# Patient Record
Sex: Female | Born: 1995 | Race: Black or African American | Hispanic: No | Marital: Single | Smoking: Never smoker
Health system: Southern US, Community
[De-identification: ages and names within clinical notes are randomized; demographics above are authoritative.]

## PROBLEM LIST (undated history)

## (undated) ENCOUNTER — Inpatient Hospital Stay (HOSPITAL_COMMUNITY): Payer: Self-pay

---
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
## 2020-06-05 ENCOUNTER — Other Ambulatory Visit: Payer: Self-pay

## 2020-06-06 ENCOUNTER — Ambulatory Visit (INDEPENDENT_AMBULATORY_CARE_PROVIDER_SITE_OTHER): Payer: 59 | Admitting: Nurse Practitioner

## 2020-06-06 ENCOUNTER — Other Ambulatory Visit (HOSPITAL_COMMUNITY)
Admission: RE | Admit: 2020-06-06 | Discharge: 2020-06-06 | Disposition: A | Discharge status: Home / Self Care | Payer: 59 | Source: Ambulatory Visit | Attending: Nurse Practitioner | Admitting: Nurse Practitioner

## 2020-06-06 ENCOUNTER — Encounter: Payer: Self-pay | Admitting: Nurse Practitioner

## 2020-06-06 VITALS — BP 110/80 | HR 82 | Temp 97.3°F | Ht 64.0 in | Wt 150.4 lb

## 2020-06-06 DIAGNOSIS — Z0000 Encounter for general adult medical examination without abnormal findings: Secondary | ICD-10-CM

## 2020-06-06 DIAGNOSIS — Z124 Encounter for screening for malignant neoplasm of cervix: Secondary | ICD-10-CM | POA: Diagnosis not present

## 2020-06-06 DIAGNOSIS — Z113 Encounter for screening for infections with a predominantly sexual mode of transmission: Secondary | ICD-10-CM | POA: Diagnosis not present

## 2020-06-06 LAB — CBC WITH DIFFERENTIAL/PLATELET
Basophils Absolute: 0 10*3/uL (ref 0.0–0.1)
Basophils Relative: 0.6 % (ref 0.0–3.0)
Eosinophils Absolute: 0 10*3/uL (ref 0.0–0.7)
Eosinophils Relative: 0.3 % (ref 0.0–5.0)
HCT: 40.6 % (ref 36.0–46.0)
Hemoglobin: 13 g/dL (ref 12.0–15.0)
Lymphocytes Relative: 31.1 % (ref 12.0–46.0)
Lymphs Abs: 1.8 10*3/uL (ref 0.7–4.0)
MCHC: 32.1 g/dL (ref 30.0–36.0)
MCV: 84.7 fl (ref 78.0–100.0)
Monocytes Absolute: 0.7 10*3/uL (ref 0.1–1.0)
Monocytes Relative: 11 % (ref 3.0–12.0)
Neutro Abs: 3.4 10*3/uL (ref 1.4–7.7)
Neutrophils Relative %: 57 % (ref 43.0–77.0)
Platelets: 302 10*3/uL (ref 150.0–400.0)
RBC: 4.79 Mil/uL (ref 3.87–5.11)
RDW: 14.1 % (ref 11.5–15.5)
WBC: 5.9 10*3/uL (ref 4.0–10.5)

## 2020-06-06 LAB — COMPREHENSIVE METABOLIC PANEL
ALT: 7 U/L (ref 0–35)
AST: 16 U/L (ref 0–37)
Albumin: 4.1 g/dL (ref 3.5–5.2)
Alkaline Phosphatase: 48 U/L (ref 39–117)
BUN: 11 mg/dL (ref 6–23)
CO2: 25 mEq/L (ref 19–32)
Calcium: 9.5 mg/dL (ref 8.4–10.5)
Chloride: 104 mEq/L (ref 96–112)
Creatinine, Ser: 0.98 mg/dL (ref 0.40–1.20)
GFR: 81.44 mL/min (ref >60.00)
Glucose, Bld: 81 mg/dL (ref 70–99)
Potassium: 4.3 mEq/L (ref 3.5–5.1)
Sodium: 138 mEq/L (ref 135–145)
Total Bilirubin: 0.4 mg/dL (ref 0.2–1.2)
Total Protein: 7.7 g/dL (ref 6.0–8.3)

## 2020-06-06 LAB — TSH: TSH: 1.56 u[IU]/mL (ref 0.35–4.50)

## 2020-06-06 NOTE — Patient Instructions (Addendum)
Thank you for choosing [facility].  Go to lab for blood draw.  Return to lab in 1week for urine cytology.   Preventive Care 18-23 Years Old, Female Preventive care refers to lifestyle choices and visits with your health care provider that can promote health and wellness. This includes:  A yearly physical exam. This is also called an annual wellness visit.  Regular dental and eye exams.  Immunizations.  Screening for certain conditions.  Healthy lifestyle choices, such as: ? Eating a healthy diet. ? Getting regular exercise. ? Not using drugs or products that contain nicotine and tobacco. ? Limiting alcohol use. What can I expect for my preventive care visit? Physical exam Your health care provider may check your:  Height and weight. These may be used to calculate your BMI (body mass index). BMI is a measurement that tells if you are at a healthy weight.  Heart rate and blood pressure.  Body temperature.  Skin for abnormal spots. Counseling Your health care provider may ask you questions about your:  Past medical problems.  Family's medical history.  Alcohol, tobacco, and drug use.  Emotional well-being.  Home life and relationship well-being.  Sexual activity.  Diet, exercise, and sleep habits.  Work and work Statistician.  Access to firearms.  Method of birth control.  Menstrual cycle.  Pregnancy history. What immunizations do I need? Vaccines are usually given at various ages, according to a schedule. Your health care provider will recommend vaccines for you based on your age, medical history, and lifestyle or other factors, such as travel or where you work.   What tests do I need? Blood tests  Lipid and cholesterol levels. These may be checked every 5 years starting at age 51.  Hepatitis C test.  Hepatitis B test. Screening  Diabetes screening. This is done by checking your blood sugar (glucose) after you have not eaten for a while  (fasting).  STD (sexually transmitted disease) testing, if you are at risk.  BRCA-related cancer screening. This may be done if you have a family history of breast, ovarian, tubal, or peritoneal cancers.  Pelvic exam and Pap test. This may be done every 3 years starting at age 37. Starting at age 62, this may be done every 5 years if you have a Pap test in combination with an HPV test. Talk with your health care provider about your test results, treatment options, and if necessary, the need for more tests.   Follow these instructions at home: Eating and drinking  Eat a healthy diet that includes fresh fruits and vegetables, whole grains, lean protein, and low-fat dairy products.  Take vitamin and mineral supplements as recommended by your health care provider.  Do not drink alcohol if: ? Your health care provider tells you not to drink. ? You are pregnant, may be pregnant, or are planning to become pregnant.  If you drink alcohol: ? Limit how much you have to 0-1 drink a day. ? Be aware of how much alcohol is in your drink. In the [place] one drink equals one 12 oz bottle of beer (355 mL), one 5 oz glass of wine (148 mL), or one 1 oz glass of hard liquor (44 mL).   Lifestyle  Take daily care of your teeth and gums. Brush your teeth every morning and night with fluoride toothpaste. Floss one time each day.  Stay active. Exercise for at least 30 minutes 5 or more days each week.  Do not use any products  that contain nicotine or tobacco, such as cigarettes, e-cigarettes, and chewing tobacco. If you need help quitting, ask your health care provider.  Do not use drugs.  If you are sexually active, practice safe sex. Use a condom or other form of protection to prevent STIs (sexually transmitted infections).  If you do not wish to become pregnant, use a form of birth control. If you plan to become pregnant, see your health care provider for a prepregnancy visit.  Find healthy ways to cope  with stress, such as: ? Meditation, yoga, or listening to music. ? Journaling. ? Talking to a trusted person. ? Spending time with friends and family. Safety  Always wear your seat belt while driving or riding in a vehicle.  Do not drive: ? If you have been drinking alcohol. Do not ride with someone who has been drinking. ? When you are tired or distracted. ? While texting.  Wear a helmet and other protective equipment during sports activities.  If you have firearms in your house, make sure you follow all gun safety procedures.  Seek help if you have been physically or sexually abused. What's next?  Go to your health care provider once a year for an annual wellness visit.  Ask your health care provider how often you should have your eyes and teeth checked.  Stay up to date on all vaccines. This information is not intended to replace advice given to you by your health care provider. Make sure you discuss any questions you have with your health care provider. Document Revised: 08/27/2019 Document Reviewed: 09/09/2017 Elsevier Patient Education  2021 [employer].

## 2020-06-06 NOTE — Progress Notes (Signed)
Subjective:  Patient ID: Amber Shepard, female    DOB: 1995/05/18  Age: 25 y.o. MRN: [id number]  CC: Establish Care (New patient-Pap and breast exam needed. Pt is fasting. )  HPI She denies any acute compliant. She has completed HPV, TDAP and 2doses of COVID vaccines. She is sexually active with partner. She agreed to STD screen today  She exercises 2-3x/week, has regular diet  Wt Readings from Last 3 Encounters:  06/06/20 150 lb 6.4 oz (68.2 kg)   BP Readings from Last 3 Encounters:  06/06/20 110/80   Depression screen PHQ 2/9 06/06/2020  Decreased Interest 0  Down, Depressed, Hopeless 0  PHQ - 2 Score 0  Altered sleeping 0  Tired, decreased energy 1  Change in appetite 0  Feeling bad or failure about yourself  0  Trouble concentrating 0  Moving slowly or fidgety/restless 0  Suicidal thoughts 0  PHQ-9 Score 1  Difficult doing work/chores Not difficult at all   GAD 7 : Generalized Anxiety Score 06/06/2020  Nervous, Anxious, on Edge 1  Control/stop worrying 1  Worry too much - different things 0  Trouble relaxing 0  Restless 0  Easily annoyed or irritable 0  Afraid - awful might happen 0  Total GAD 7 Score 2  Anxiety Difficulty Not difficult at all   Fall Risk  06/06/2020  Falls in the past year? 0  Number falls in past yr: 0  Injury with Fall? 0  Risk for fall due to : No Fall Risks  Follow up Falls evaluation completed   History reviewed. No pertinent past medical history.   Social History   Socioeconomic History  . Marital status: Single    Spouse name: Not on file  . Number of children: 0  . Years of education: Not on file  . Highest education level: Not on file  Occupational History  . Not on file  Tobacco Use  . Smoking status: Never Smoker  . Smokeless tobacco: Never Used  Vaping Use  . Vaping Use: Never used  Substance and Sexual Activity  . Alcohol use: Yes    Comment: wine occassionally   . Drug use: Never  . Sexual activity: Not  Currently    Birth control/protection: Pill  Other Topics Concern  . Not on file  Social History Narrative  . Not on file   Social Determinants of Health   Financial Resource Strain: Not on file  Food Insecurity: Not on file  Transportation Needs: Not on file  Physical Activity: Not on file  Stress: Not on file  Social Connections: Not on file  Intimate Partner Violence: Not on file   Outpatient Medications Prior to Visit  Medication Sig Dispense Refill  . doxycycline (VIBRAMYCIN) 100 MG capsule Take 100 mg by mouth in the morning and at bedtime.    . norgestimate-ethinyl estradiol (ORTHO-CYCLEN) 0.25-35 MG-MCG tablet Take 1 tablet by mouth daily.     No facility-administered medications prior to visit.   History reviewed. No pertinent family history.   ROS Review of Systems  Constitutional: Negative for fever, malaise/fatigue and weight loss.  HENT: Negative for congestion and sore throat.   Eyes:       Negative for visual changes  Respiratory: Negative for cough and shortness of breath.   Cardiovascular: Negative for chest pain, palpitations and leg swelling.  Gastrointestinal: Negative for blood in stool, constipation, diarrhea and heartburn.  Genitourinary: Negative for dysuria, frequency and urgency.  Musculoskeletal: Negative for falls,  joint pain and myalgias.  Skin: Negative for rash.  Neurological: Negative for dizziness, sensory change and headaches.  Endo/Heme/Allergies: Does not bruise/bleed easily.  Psychiatric/Behavioral: Negative for depression, substance abuse and suicidal ideas. The patient is not nervous/anxious and does not have insomnia.     Objective:  BP 110/80 (BP Location: Left Arm, Patient Position: Sitting, Cuff Size: Normal)   Pulse 82   Temp (!) 97.3 F (36.3 C) (Temporal)   Ht 5\' 4"  (1.626 m)   Wt 150 lb 6.4 oz (68.2 kg)   LMP 05/13/2020 (Approximate)   SpO2 100%   BMI 25.82 kg/m   Physical Exam Vitals reviewed. Exam conducted with  a chaperone present.  Constitutional:      General: She is not in acute distress.    Appearance: She is well-developed and normal weight.  HENT:     Right Ear: Tympanic membrane, ear canal and external ear normal.     Left Ear: Tympanic membrane, ear canal and external ear normal.  Eyes:     Extraocular Movements: Extraocular movements intact.     Conjunctiva/sclera: Conjunctivae normal.  Cardiovascular:     Rate and Rhythm: Normal rate and regular rhythm.     Heart sounds: Normal heart sounds.  Pulmonary:     Effort: Pulmonary effort is normal. No respiratory distress.     Breath sounds: Normal breath sounds.  Chest:     Chest wall: No tenderness.  Breasts:     Right: Normal. No axillary adenopathy or supraclavicular adenopathy.     Left: Normal. No axillary adenopathy or supraclavicular adenopathy.    Abdominal:     General: Bowel sounds are normal.     Palpations: Abdomen is soft.     Hernia: There is no hernia in the left inguinal area or right inguinal area.  Genitourinary:    General: Normal vulva.     Labia:        Right: No rash, tenderness or lesion.        Left: No rash, tenderness or lesion.      Urethra: No prolapse or urethral pain.     Vagina: Vaginal discharge present. No erythema or tenderness.     Cervix: Discharge present. No cervical motion tenderness, friability, lesion or erythema.     Uterus: Normal.      Adnexa: Right adnexa normal and left adnexa normal.  Musculoskeletal:        General: Normal range of motion.     Cervical back: Normal range of motion and neck supple.     Right lower leg: No edema.     Left lower leg: No edema.  Lymphadenopathy:     Cervical: No cervical adenopathy.     Upper Body:     Right upper body: No supraclavicular, axillary or pectoral adenopathy.     Left upper body: No supraclavicular, axillary or pectoral adenopathy.     Lower Body: No right inguinal adenopathy. No left inguinal adenopathy.  Skin:    General: Skin  is warm and dry.  Neurological:     Mental Status: She is alert and oriented to person, place, and time.     Deep Tendon Reflexes: Reflexes are normal and symmetric.  Psychiatric:        Mood and Affect: Mood normal.        Behavior: Behavior normal.        Thought Content: Thought content normal.    Assessment & Plan:  This visit occurred during the SARS-CoV-2 public  health emergency.  Safety protocols were in place, including screening questions prior to the visit, additional usage of staff PPE, and extensive cleaning of exam room while observing appropriate contact time as indicated for disinfecting solutions.   Jakirah was seen today for establish care.  Diagnoses and all orders for this visit:  Preventative health care -     CBC with Differential/Platelet -     Comprehensive metabolic panel -     TSH -     HIV antibody (with reflex) -     RPR -     Hepatitis C Antibody -     Cytology - PAP( [facility])  Screen for STD (sexually transmitted disease) -     HIV antibody (with reflex) -     RPR -     Hepatitis C Antibody -     Urine cytology ancillary only([facility]); Future  Encounter for Papanicolaou smear for cervical cancer screening -     Cytology - PAP( [place])   Problem List Items Addressed This Visit   None   Visit Diagnoses    Preventative health care    -  Primary   Relevant Orders   CBC with Differential/Platelet   Comprehensive metabolic panel   TSH   HIV antibody (with reflex)   RPR   Hepatitis C Antibody   Cytology - PAP( [facility])   Screen for STD (sexually transmitted disease)       Relevant Orders   HIV antibody (with reflex)   RPR   Hepatitis C Antibody   Urine cytology ancillary only([facility])   Encounter for Papanicolaou smear for cervical cancer screening       Relevant Orders   Cytology - PAP( )      Follow-up: Return in about 1 year (around 06/06/2021) for CPE (fasting).  Alysia Penna, NP

## 2020-06-07 LAB — HEPATITIS C ANTIBODY
Hepatitis C Ab: NONREACTIVE
SIGNAL TO CUT-OFF: 0.02 (ref <1.00)

## 2020-06-07 LAB — RPR: RPR Ser Ql: NONREACTIVE

## 2020-06-07 LAB — HIV ANTIBODY (ROUTINE TESTING W REFLEX): HIV 1&2 Ab, 4th Generation: NONREACTIVE

## 2020-06-07 LAB — CYTOLOGY - PAP: Diagnosis: NEGATIVE

## 2020-06-13 ENCOUNTER — Other Ambulatory Visit: Payer: 59

## 2020-06-20 ENCOUNTER — Other Ambulatory Visit: Payer: Self-pay

## 2020-06-20 ENCOUNTER — Other Ambulatory Visit (INDEPENDENT_AMBULATORY_CARE_PROVIDER_SITE_OTHER): Payer: 59

## 2020-06-20 ENCOUNTER — Other Ambulatory Visit (HOSPITAL_COMMUNITY)
Admission: RE | Admit: 2020-06-20 | Discharge: 2020-06-20 | Disposition: A | Discharge status: Home / Self Care | Payer: 59 | Source: Ambulatory Visit | Attending: Nurse Practitioner | Admitting: Nurse Practitioner

## 2020-06-20 DIAGNOSIS — Z113 Encounter for screening for infections with a predominantly sexual mode of transmission: Secondary | ICD-10-CM | POA: Diagnosis not present

## 2020-06-20 NOTE — Progress Notes (Signed)
Per orders of NP Alysia Penna pt is here to provide urine sample, pt was ale to leave an adequate amount.

## 2020-06-21 LAB — URINE CYTOLOGY ANCILLARY ONLY
Bacterial Vaginitis-Urine: NEGATIVE
Candida Urine: NEGATIVE
Chlamydia: NEGATIVE
Comment: NEGATIVE
Comment: NORMAL
Neisseria Gonorrhea: NEGATIVE

## 2020-06-24 ENCOUNTER — Telehealth: Payer: Self-pay | Admitting: Nurse Practitioner

## 2020-06-24 NOTE — Telephone Encounter (Signed)
Patient aware of results.

## 2020-06-24 NOTE — Telephone Encounter (Signed)
Pt would like a cb concerning her most recent lab results. Please advise pt at [phone].

## 2021-06-09 ENCOUNTER — Encounter: Payer: 59 | Admitting: Nurse Practitioner

## 2021-06-10 ENCOUNTER — Encounter: Payer: 59 | Admitting: Nurse Practitioner

## 2021-07-22 ENCOUNTER — Encounter: Payer: Self-pay | Admitting: Nurse Practitioner

## 2021-07-22 ENCOUNTER — Ambulatory Visit (INDEPENDENT_AMBULATORY_CARE_PROVIDER_SITE_OTHER): Payer: 59 | Admitting: Nurse Practitioner

## 2021-07-22 VITALS — BP 122/80 | HR 78 | Temp 98.2°F | Ht 64.0 in | Wt 162.0 lb

## 2021-07-22 DIAGNOSIS — Z0000 Encounter for general adult medical examination without abnormal findings: Secondary | ICD-10-CM | POA: Diagnosis not present

## 2021-07-22 NOTE — Progress Notes (Unsigned)
Complete physical exam  Patient: Amber Shepard   DOB: [birth year]   26 y.o. Female  MRN: [id number] Visit Date: 07/24/2021  Subjective:    Chief Complaint  Patient presents with   Annual Exam    CPE Pt fasting No concerns   Amber Shepard Tech is a 26 y.o. female who presents today for a complete physical exam. She reports consuming a general diet. Gym/ health club routine includes cardio and light weights. She generally feels well. She reports sleeping well. She does not have additional problems to discuss today.  Vision:No Dental:Yes STD Screen:No  Wt Readings from Last 3 Encounters:  07/22/21 162 lb (73.5 kg)  06/06/20 150 lb 6.4 oz (68.2 kg)    BP Readings from Last 3 Encounters:  07/22/21 122/80  06/06/20 110/80    Most recent fall risk assessment:    06/06/2020    9:48 AM  Fall Risk   Falls in the past year? 0  Number falls in past yr: 0  Injury with Fall? 0  Risk for fall due to : No Fall Risks  Follow up Falls evaluation completed   Most recent depression screenings:    07/22/2021    9:51 AM 06/06/2020   10:57 AM  PHQ 2/9 Scores  PHQ - 2 Score 0 0  PHQ- 9 Score 0 1   HPI  No problem-specific Assessment & Plan notes found for this encounter.  History reviewed. No pertinent past medical history. History reviewed. No pertinent surgical history. Social History   Socioeconomic History   Marital status: Single    Spouse name: Not on file   Number of children: 0   Years of education: Not on file   Highest education level: Not on file  Occupational History   Not on file  Tobacco Use   Smoking status: Never   Smokeless tobacco: Never  Vaping Use   Vaping Use: Never used  Substance and Sexual Activity   Alcohol use: Yes    Comment: wine occassionally    Drug use: Never   Sexual activity: Not Currently    Birth control/protection: Pill  Other Topics Concern   Not on file  Social History Narrative   Not on file   Social Determinants of Health   Financial  Resource Strain: Not on file  Food Insecurity: Not on file  Transportation Needs: Not on file  Physical Activity: Not on file  Stress: Not on file  Social Connections: Not on file  Intimate Partner Violence: Not on file   Family Status  Relation Name Status   Mother  Alive   Father  Alive   History reviewed. No pertinent family history. No Known Allergies  Patient Care Team: Juliany Daughety, Charlene Brooke, NP as PCP - General (Internal Medicine)   Medications: Outpatient Medications Prior to Visit  Medication Sig   [DISCONTINUED] doxycycline (VIBRAMYCIN) 100 MG capsule Take 100 mg by mouth in the morning and at bedtime. (Patient not taking: Reported on 07/22/2021)   [DISCONTINUED] norgestimate-ethinyl estradiol (ORTHO-CYCLEN) 0.25-35 MG-MCG tablet Take 1 tablet by mouth daily. (Patient not taking: Reported on 07/22/2021)   No facility-administered medications prior to visit.   Review of Systems  Constitutional:  Negative for fever.  HENT:  Negative for congestion and sore throat.   Eyes:        Negative for visual changes  Respiratory:  Negative for cough and shortness of breath.   Cardiovascular:  Negative for chest pain, palpitations and leg swelling.  Gastrointestinal:  Negative for blood in stool, constipation and diarrhea.  Genitourinary:  Negative for dysuria, frequency and urgency.  Musculoskeletal:  Negative for myalgias.  Skin:  Negative for rash.  Neurological:  Negative for dizziness and headaches.  Hematological:  Does not bruise/bleed easily.  Psychiatric/Behavioral:  Negative for suicidal ideas. The patient is not nervous/anxious.        Objective:  BP 122/80 (BP Location: Right Arm, Patient Position: Sitting, Cuff Size: Small)   Pulse 78   Temp 98.2 F (36.8 C) (Temporal)   Ht _0  (1.626 m)   Wt 162 lb (73.5 kg)   LMP 07/12/2021 (Exact Date)   SpO2 99%   BMI 27.81 kg/m    Wt Readings from Last 3 Encounters:  07/22/21 162 lb (73.5 kg)  06/06/20 150 lb 6.4  oz (68.2 kg)      Physical Exam Vitals reviewed. Exam conducted with a chaperone present.  Cardiovascular:     Rate and Rhythm: Normal rate and regular rhythm.     Pulses: Normal pulses.     Heart sounds: Normal heart sounds.  Pulmonary:     Effort: Pulmonary effort is normal.     Breath sounds: Normal breath sounds.  Chest:  Breasts:    Breasts are symmetrical.     Right: Normal.     Left: Normal.  Abdominal:     General: Bowel sounds are normal.     Palpations: Abdomen is soft.  Musculoskeletal:     Right lower leg: No edema.     Left lower leg: No edema.  Lymphadenopathy:     Upper Body:     Right upper body: No supraclavicular, axillary or pectoral adenopathy.     Left upper body: No supraclavicular, axillary or pectoral adenopathy.  Neurological:     Mental Status: She is alert and oriented to person, place, and time.  Psychiatric:        Mood and Affect: Mood normal.        Behavior: Behavior normal.        Thought Content: Thought content normal.     No results found for any visits on 07/22/21.    Assessment & Plan:    Routine Health Maintenance and Physical Exam  Immunization History  Administered Date(s) Administered   DTaP 04/04/1997, 06/04/1997, 08/06/1997, 05/30/1998, 05/23/2002   Dtap, Unspecified 04/04/1997, 06/04/1997, 08/06/1997, 05/30/1998, 05/23/2002   HPV Quadrivalent 07/30/2009, 07/27/2011   Hepatitis A 09/02/2005, 03/23/2006   Hepatitis A, Ped/Adol-2 Dose 09/02/2005, 03/23/2006   Hepatitis Shepard, ped/adol 1997/03/15, 03/16/1997, 10/29/1997   HiB (PRP-OMP) 04/04/1997, 06/04/1997, 05/30/1998, 05/27/2009   HiB (PRP-T) 04/04/1997, 06/04/1997, 05/30/1998, 05/27/2009   IPV 04/04/1997, 06/04/1997, 08/06/1997, 05/23/2002   Influenza,inj,Quad PF,6-35 Mos 10/21/2018   MMR 02/20/1998, 05/23/2002   Meningococcal Conjugate 05/18/2008, 07/19/2015   Moderna Sars-Covid-2 Vaccination 04/26/2019, 06/06/2019   Pneumococcal Conjugate-13 02/10/1999   Rotavirus  Monovalent 04/09/1997, 06/04/1997   Tdap 04/04/1997, 06/04/1997, 08/06/1997, 05/30/1998, 05/23/2002, 05/18/2008, 07/06/2018   Varicella 02/20/1998, 05/18/2008   Health Maintenance  Topic Date Due   COVID-19 Vaccine (3 - Moderna series) 08/07/2021 (Originally 08/01/2019)   INFLUENZA VACCINE  08/12/2021   PAP-Cervical Cytology Screening  06/07/2023   PAP SMEAR-Modifier  06/07/2023   TETANUS/TDAP  07/05/2028   HPV VACCINES  Completed   Hepatitis C Screening  Completed   HIV Screening  Completed   Discussed health benefits of physical activity, and encouraged her to engage in regular exercise appropriate for her age and condition.  Problem List Items Addressed This Visit  None Visit Diagnoses     Preventative health care    -  Primary   Relevant Orders   CBC (Completed)   Comprehensive metabolic panel (Completed)      Return in about 1 year (around 07/23/2022) for CPE (fasting).     Wilfred Lacy, NP

## 2021-07-22 NOTE — Patient Instructions (Signed)
Go to lab  Preventive Care 21-24 Years Old, Female Preventive care refers to lifestyle choices and visits with your health care provider that can promote health and wellness. Preventive care visits are also called wellness exams. What can I expect for my preventive care visit? Counseling During your preventive care visit, your health care provider may ask about your: Medical history, including: Past medical problems. Family medical history. Pregnancy history. Current health, including: Menstrual cycle. Method of birth control. Emotional well-being. Home life and relationship well-being. Sexual activity and sexual health. Lifestyle, including: Alcohol, nicotine or tobacco, and drug use. Access to firearms. Diet, exercise, and sleep habits. Work and work environment. Sunscreen use. Safety issues such as seatbelt and bike helmet use. Physical exam Your health care provider may check your: Height and weight. These may be used to calculate your BMI (body mass index). BMI is a measurement that tells if you are at a healthy weight. Waist circumference. This measures the distance around your waistline. This measurement also tells if you are at a healthy weight and may help predict your risk of certain diseases, such as type 2 diabetes and high blood pressure. Heart rate and blood pressure. Body temperature. Skin for abnormal spots. What immunizations do I need?  Vaccines are usually given at various ages, according to a schedule. Your health care provider will recommend vaccines for you based on your age, medical history, and lifestyle or other factors, such as travel or where you work. What tests do I need? Screening Your health care provider may recommend screening tests for certain conditions. This may include: Pelvic exam and Pap test. Lipid and cholesterol levels. Diabetes screening. This is done by checking your blood sugar (glucose) after you have not eaten for a while  (fasting). Hepatitis B test. Hepatitis C test. HIV (human immunodeficiency virus) test. STI (sexually transmitted infection) testing, if you are at risk. BRCA-related cancer screening. This may be done if you have a family history of breast, ovarian, tubal, or peritoneal cancers. Talk with your health care provider about your test results, treatment options, and if necessary, the need for more tests. Follow these instructions at home: Eating and drinking  Eat a healthy diet that includes fresh fruits and vegetables, whole grains, lean protein, and low-fat dairy products. Take vitamin and mineral supplements as recommended by your health care provider. Do not drink alcohol if: Your health care provider tells you not to drink. You are pregnant, may be pregnant, or are planning to become pregnant. If you drink alcohol: Limit how much you have to 0-1 drink a day. Know how much alcohol is in your drink. In the [place] one drink equals one 12 oz bottle of beer (355 mL), one 5 oz glass of wine (148 mL), or one 1 oz glass of hard liquor (44 mL). Lifestyle Brush your teeth every morning and night with fluoride toothpaste. Floss one time each day. Exercise for at least 30 minutes 5 or more days each week. Do not use any products that contain nicotine or tobacco. These products include cigarettes, chewing tobacco, and vaping devices, such as e-cigarettes. If you need help quitting, ask your health care provider. Do not use drugs. If you are sexually active, practice safe sex. Use a condom or other form of protection to prevent STIs. If you do not wish to become pregnant, use a form of birth control. If you plan to become pregnant, see your health care provider for a prepregnancy visit. Find healthy ways to manage   stress, such as: Meditation, yoga, or listening to music. Journaling. Talking to a trusted person. Spending time with friends and family. Minimize exposure to UV radiation to reduce your  risk of skin cancer. Safety Always wear your seat belt while driving or riding in a vehicle. Do not drive: If you have been drinking alcohol. Do not ride with someone who has been drinking. If you have been using any mind-altering substances or drugs. While texting. When you are tired or distracted. Wear a helmet and other protective equipment during sports activities. If you have firearms in your house, make sure you follow all gun safety procedures. Seek help if you have been physically or sexually abused. What's next? Go to your health care provider once a year for an annual wellness visit. Ask your health care provider how often you should have your eyes and teeth checked. Stay up to date on all vaccines. This information is not intended to replace advice given to you by your health care provider. Make sure you discuss any questions you have with your health care provider. Document Revised: 06/26/2020 Document Reviewed: 06/26/2020 [employer] Patient Education  2023 [employer]  

## 2021-07-24 ENCOUNTER — Other Ambulatory Visit (INDEPENDENT_AMBULATORY_CARE_PROVIDER_SITE_OTHER): Payer: 59

## 2021-07-24 ENCOUNTER — Encounter: Payer: Self-pay | Admitting: Nurse Practitioner

## 2021-07-24 DIAGNOSIS — Z0000 Encounter for general adult medical examination without abnormal findings: Secondary | ICD-10-CM | POA: Diagnosis not present

## 2021-07-24 LAB — CBC
HCT: 38.8 % (ref 36.0–46.0)
Hemoglobin: 12.6 g/dL (ref 12.0–15.0)
MCHC: 32.4 g/dL (ref 30.0–36.0)
MCV: 85.1 fl (ref 78.0–100.0)
Platelets: 281 10*3/uL (ref 150.0–400.0)
RBC: 4.56 Mil/uL (ref 3.87–5.11)
RDW: 13.2 % (ref 11.5–15.5)
WBC: 4.8 10*3/uL (ref 4.0–10.5)

## 2021-07-24 LAB — COMPREHENSIVE METABOLIC PANEL
ALT: 6 U/L (ref 0–35)
AST: 15 U/L (ref 0–37)
Albumin: 4.3 g/dL (ref 3.5–5.2)
Alkaline Phosphatase: 54 U/L (ref 39–117)
BUN: 8 mg/dL (ref 6–23)
CO2: 25 mEq/L (ref 19–32)
Calcium: 9.3 mg/dL (ref 8.4–10.5)
Chloride: 102 mEq/L (ref 96–112)
Creatinine, Ser: 0.92 mg/dL (ref 0.40–1.20)
GFR: 87.16 mL/min (ref >60.00)
Glucose, Bld: 82 mg/dL (ref 70–99)
Potassium: 3.8 mEq/L (ref 3.5–5.1)
Sodium: 135 mEq/L (ref 135–145)
Total Bilirubin: 0.7 mg/dL (ref 0.2–1.2)
Total Protein: 7.8 g/dL (ref 6.0–8.3)

## 2021-07-24 NOTE — Progress Notes (Signed)
Left hand medial wrist right above bone asia got with regular butterfly

## 2022-07-23 ENCOUNTER — Encounter: Payer: Self-pay | Admitting: Nurse Practitioner
